# Patient Record
Sex: Male | Born: 1995
Health system: Southern US, Community
[De-identification: ages and names within clinical notes are randomized; demographics above are authoritative.]

## PROBLEM LIST (undated history)

## (undated) DIAGNOSIS — J302 Other seasonal allergic rhinitis: Secondary | ICD-10-CM

## (undated) DIAGNOSIS — F909 Attention-deficit hyperactivity disorder, unspecified type: Secondary | ICD-10-CM

## (undated) DIAGNOSIS — J45909 Unspecified asthma, uncomplicated: Secondary | ICD-10-CM

## (undated) DIAGNOSIS — Z9109 Other allergy status, other than to drugs and biological substances: Secondary | ICD-10-CM

## (undated) DIAGNOSIS — S62609A Fracture of unspecified phalanx of unspecified finger, initial encounter for closed fracture: Secondary | ICD-10-CM

## (undated) DIAGNOSIS — G43909 Migraine, unspecified, not intractable, without status migrainosus: Secondary | ICD-10-CM

## (undated) HISTORY — PX: TONSILLECTOMY: SUR1361

---
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
## 2017-02-11 ENCOUNTER — Encounter: Payer: Self-pay | Admitting: Surgical

## 2017-02-12 ENCOUNTER — Ambulatory Visit: Payer: Self-pay | Admitting: Family Medicine

## 2017-02-12 DIAGNOSIS — Z0289 Encounter for other administrative examinations: Secondary | ICD-10-CM

## 2017-02-18 ENCOUNTER — Telehealth (INDEPENDENT_AMBULATORY_CARE_PROVIDER_SITE_OTHER): Payer: Self-pay | Admitting: Orthopaedic Surgery

## 2017-02-18 NOTE — Telephone Encounter (Deleted)
Pt mother called to cancel surgery tomorrow with XU. Please call mother to confirm sx has been cancelent. Mother, Nathen May, request ret call [phone].

## 2017-02-18 NOTE — Telephone Encounter (Signed)
Wrong patient

## 2017-02-25 ENCOUNTER — Encounter: Payer: Self-pay | Admitting: Family Medicine

## 2017-02-25 ENCOUNTER — Ambulatory Visit (INDEPENDENT_AMBULATORY_CARE_PROVIDER_SITE_OTHER): Payer: BLUE CROSS/BLUE SHIELD | Admitting: Family Medicine

## 2017-02-25 DIAGNOSIS — F909 Attention-deficit hyperactivity disorder, unspecified type: Secondary | ICD-10-CM | POA: Diagnosis not present

## 2017-02-25 DIAGNOSIS — J45909 Unspecified asthma, uncomplicated: Secondary | ICD-10-CM

## 2017-02-25 DIAGNOSIS — J4530 Mild persistent asthma, uncomplicated: Secondary | ICD-10-CM | POA: Insufficient documentation

## 2017-02-25 MED ORDER — AMPHETAMINE-DEXTROAMPHETAMINE 10 MG PO TABS: ORAL_TABLET | ORAL | 0 refills | Status: DC

## 2017-02-25 NOTE — Progress Notes (Signed)
Charles Avila is a 21 y.o. [facility]   Patient Care Team: Charles Avila, Charles Avila as PCP - General (Family Medicine)   History of Present Illness:   Charles Avila, acting as scribe for Dr. Earlene Avila.  HPI:  Patient comes in today to establish care.  He will be starting school in the spring at [employer] in [place].  Patient has asthma.  Uses daily inhaler as well as rescue inhaler and nebulizer if needed.  States he doesn't have to use his rescue inhaler or nebulizer very often.  He previously played lacrosse in school and states he had to use his inhaler more at that time.  Patient also takes Concerta for ADHD.  No concerns or complaints today.  Needs to establish with a provider because he aged out of pediatrics.  Health Maintenance Due  Topic Date Due  . HIV Screening  03/02/2013   Depression screen PHQ 2/9 02/25/2017  Decreased Interest 0  Down, Depressed, Hopeless 0  PHQ - 2 Score 0   PMHx, SurgHx, SocialHx, Medications, and Allergies were reviewed in the Visit Navigator and updated as appropriate.   Past Medical History:  Diagnosis Date  . ADHD (attention deficit hyperactivity disorder)   . Asthma   . Migraine   . Multiple environmental allergies   . Seasonal allergies    Past Surgical History:  Procedure Laterality Date  . TONSILLECTOMY     Family History  Problem Relation Age of Onset  . Hypertension Father   . Hypertension Brother   . Diabetes Maternal Grandmother   . High Cholesterol Maternal Grandmother   . Hypertension Maternal Grandmother   . Mental illness Maternal Grandmother   . Diabetes Maternal Grandfather   . High Cholesterol Maternal Grandfather   . Hypertension Maternal Grandfather   . Cancer Paternal Grandmother   . Heart disease Paternal Grandfather   . Hypertension Paternal Grandfather    Social History  Substance Use Topics  . Smoking status: Never Smoker  . Smokeless tobacco: Never Used  . Alcohol use Yes   Comment: very rarely   Current Medications and Allergies:   .  albuterol (PROVENTIL HFA;VENTOLIN HFA) 108 (90 BASE) MCG/ACT inhaler, Inhale 2 puffs into the lungs every 6 (six) hours as needed. For shortness of breath or wheezing, Disp: , Rfl:  .  albuterol (PROVENTIL) (2.5 MG/3ML) 0.083% nebulizer solution, Take 2.5 mg by nebulization daily., Disp: , Rfl:  .  beclomethasone (QVAR) 40 MCG/ACT inhaler, Inhale 1 puff into the lungs daily., Disp: , Rfl:  .  cetirizine (ZYRTEC) 10 MG tablet, Take 10 mg by mouth daily., Disp: , Rfl:  .  levalbuterol (XOPENEX HFA) 45 MCG/ACT inhaler, Inhale 1 puff into the lungs daily., Disp: , Rfl:  .  Levalbuterol HCl (XOPENEX CONCENTRATE IN), Inhale 1 vial into the lungs 2 (two) times daily as needed.  .  methylphenidate (CONCERTA) 54 MG CR tablet, Take 54 mg by mouth every morning., Disp: , Rfl:  .  SUMAtriptan (IMITREX) 25 MG tablet, Take 25 mg by mouth every 2 (two) hours as needed. For headache, Disp: , Rfl:  .  topiramate (TOPAMAX) 25 MG tablet, Take 25 mg by mouth daily., Disp: , Rfl:   No Known Allergies   Review of Systems:   Pertinent items are noted in the HPI. Otherwise, ROS is negative.  Vitals:   Vitals:   02/25/17 0925  BP: 124/78  Pulse: 69  Temp: 98 F (36.7 C)  TempSrc: Oral  SpO2: 96%  Weight: 164 lb (74.4 kg)  Height: 5\' 7"  (1.702 m)     Body mass index is 25.69 kg/m.  Physical Exam:   Physical Exam  Constitutional: He is oriented to person, place, and time. He appears well-developed and well-nourished. No distress.  HENT:  Head: Normocephalic and atraumatic.  Right Ear: External ear normal.  Left Ear: External ear normal.  Nose: Nose normal.  Mouth/Throat: Oropharynx is clear and moist.  Eyes: Pupils are equal, round, and reactive to light. Conjunctivae and EOM are normal.  Neck: Normal range of motion. Neck supple.  Cardiovascular: Normal rate, regular rhythm, normal heart sounds and intact distal pulses.     Pulmonary/Chest: Effort normal and breath sounds normal.  Abdominal: Soft. Bowel sounds are normal.  Musculoskeletal: Normal range of motion.  Neurological: He is alert and oriented to person, place, and time.  Skin: Skin is warm and dry.  Psychiatric: He has a normal mood and affect. His behavior is normal. Judgment and thought content normal.  Nursing note and vitals reviewed.  Assessment and Plan:   Charles Avila was seen today for establish care.  Diagnoses and all orders for this visit:  Attention deficit hyperactivity disorder (ADHD), unspecified ADHD type Comments: ADHD symptoms not controlled well on current regimen. After discussion, patient would like to start below medication. Expectations, risks, and potential side effects reviewed. Will recheck in one month.  Orders: -     amphetamine-dextroamphetamine (ADDERALL) 10 MG tablet; Take 2 tablets in the morning and 1 tablet in the afternoon  Uncomplicated asthma, unspecified asthma severity, unspecified whether persistent Comments: Well controlled.  No signs of complications, medication side effects, or red flags.  Continue current regimen.     . Reviewed expectations re: course of current medical issues. . Discussed self-management of symptoms. . Outlined signs and symptoms indicating need for more acute intervention. . Patient verbalized understanding and all questions were answered. Marland Kitchen. Health Maintenance issues including appropriate healthy diet, exercise, and smoking avoidance were discussed with patient. . See orders for this visit as documented in the electronic medical record. . Patient received an After Visit Summary.  Avila served as Neurosurgeonscribe during this visit. History, Physical, and Plan performed by medical provider. The above documentation has been reviewed and is accurate and complete. Charles RimaErica Keli Avila, D.O.  Charles RimaErica Arnice Vanepps, Avila , [facility] 03/02/2017

## 2017-02-25 NOTE — Patient Instructions (Signed)
Stop Topamax.  Stop Concerta.  Start Adderall 10 mg.  You can take 1 in the morning and one in the afternoon to start.  If you need to you can increase to 2 in the morning and 1 in the afternoon.

## 2017-06-11 ENCOUNTER — Encounter: Payer: Self-pay | Admitting: *Deleted

## 2017-06-11 ENCOUNTER — Ambulatory Visit: Payer: BLUE CROSS/BLUE SHIELD | Admitting: Physician Assistant

## 2017-06-11 ENCOUNTER — Encounter: Payer: Self-pay | Admitting: Physician Assistant

## 2017-06-11 VITALS — BP 122/80 | HR 63 | Temp 97.6°F | Ht 67.0 in | Wt 162.4 lb

## 2017-06-11 DIAGNOSIS — F909 Attention-deficit hyperactivity disorder, unspecified type: Secondary | ICD-10-CM

## 2017-06-11 DIAGNOSIS — Z79899 Other long term (current) drug therapy: Secondary | ICD-10-CM | POA: Diagnosis not present

## 2017-06-11 MED ORDER — AMPHETAMINE-DEXTROAMPHETAMINE 10 MG PO TABS: ORAL_TABLET | ORAL | 0 refills | Status: DC

## 2017-06-11 NOTE — Progress Notes (Signed)
Charles Avila is a 21 y.o. male is here to discuss: ADHD  I acted as a Neurosurgeon for [employer], PA-C Corky Mull, LPN  History of Present Illness:   Chief Complaint  Patient presents with  . Follow-up  . ADHD    HPI  Pt is here to discuss ADHD medication, was on Concerta 54 mg 2 months ago. He states that he takes this medication 4 x a week.  He established care with Dr. Helane Rima on 02/25/2017 and was given a prescription to try Adderall 10 mg TID (1 in the morning and 2 in the afternoon) instead of Concerta, he reports that he never filled this medication and mother reports this as well. He has lost about 15 lb since being on Concerta, reports decreased appetite. Denies heart palpitations or any updates/changes to his family's cardiac history.   [place] Drug Database confirms that he did not fill this medication. He is agreeable to signing drug contract and having urine drug screen performed today.   Health Maintenance Due  Topic Date Due  . HIV Screening  03/02/2013  . TETANUS/TDAP  03/02/2017    Past Medical History:  Diagnosis Date  . ADHD (attention deficit hyperactivity disorder)   . Asthma   . Migraine   . Multiple environmental allergies   . Seasonal allergies      Social History   Socioeconomic History  . Marital status: Single    Spouse name: Not on file  . Number of children: Not on file  . Years of education: Not on file  . Highest education level: Not on file  Social Needs  . Financial resource strain: Not on file  . Food insecurity - worry: Not on file  . Food insecurity - inability: Not on file  . Transportation needs - medical: Not on file  . Transportation needs - non-medical: Not on file  Occupational History  . Not on file  Tobacco Use  . Smoking status: Never Smoker  . Smokeless tobacco: Never Used  Substance and Sexual Activity  . Alcohol use: Yes    Comment: very rarely  . Drug use: No  . Sexual activity: Yes    Partners: Female    Other Topics Concern  . Not on file  Social History Narrative  . Not on file    Past Surgical History:  Procedure Laterality Date  . TONSILLECTOMY      Family History  Problem Relation Age of Onset  . Hypertension Father   . Hypertension Brother   . Diabetes Maternal Grandmother   . High Cholesterol Maternal Grandmother   . Hypertension Maternal Grandmother   . Mental illness Maternal Grandmother   . Diabetes Maternal Grandfather   . High Cholesterol Maternal Grandfather   . Hypertension Maternal Grandfather   . Cancer Paternal Grandmother   . Heart disease Paternal Grandfather   . Hypertension Paternal Grandfather     PMHx, SurgHx, SocialHx, FamHx, Medications, and Allergies were reviewed in the Visit Navigator and updated as appropriate.   Patient Active Problem List   Diagnosis Date Noted  . ADHD 02/25/2017  . Asthma 02/25/2017    Social History   Tobacco Use  . Smoking status: Never Smoker  . Smokeless tobacco: Never Used  Substance Use Topics  . Alcohol use: Yes    Comment: very rarely  . Drug use: No    Current Medications and Allergies:    Current Outpatient Medications:  .  albuterol (PROVENTIL HFA;VENTOLIN HFA) 108 (  90 BASE) MCG/ACT inhaler, Inhale 2 puffs into the lungs every 6 (six) hours as needed. For shortness of breath or wheezing, Disp: , Rfl:  .  albuterol (PROVENTIL) (2.5 MG/3ML) 0.083% nebulizer solution, Take 2.5 mg by nebulization daily., Disp: , Rfl:  .  amphetamine-dextroamphetamine (ADDERALL) 10 MG tablet, Take 2 tablets in the morning and 1 tablet in the afternoon, Disp: 90 tablet, Rfl: 0 .  beclomethasone (QVAR) 40 MCG/ACT inhaler, Inhale 1 puff into the lungs daily., Disp: , Rfl:  .  cetirizine (ZYRTEC) 10 MG tablet, Take 10 mg by mouth daily., Disp: , Rfl:  .  levalbuterol (XOPENEX HFA) 45 MCG/ACT inhaler, Inhale 1 puff into the lungs daily., Disp: , Rfl:  .  Levalbuterol HCl (XOPENEX CONCENTRATE IN), Inhale 1 vial into the lungs 2  (two) times daily as needed. For shortness of breath or wheezing, Disp: , Rfl:  .  SUMAtriptan (IMITREX) 25 MG tablet, Take 25 mg by mouth every 2 (two) hours as needed. For headache, Disp: , Rfl:  .  topiramate (TOPAMAX) 25 MG tablet, Take 25 mg by mouth daily., Disp: , Rfl:   No Known Allergies  Review of Systems   ROS  Negative unless otherwise specified per HPI.  Vitals:   Vitals:   06/11/17 0940  BP: 122/80  Pulse: 63  Temp: 97.6 F (36.4 C)  TempSrc: Oral  SpO2: 96%  Weight: 162 lb 6.1 oz (73.7 kg)  Height: 5\' 7"  (1.702 m)     Body mass index is 25.43 kg/m.   Physical Exam:    Physical Exam  Constitutional: He appears well-developed. He is cooperative.  Non-toxic appearance. He does not have a sickly appearance. He does not appear ill. No distress.  Cardiovascular: Normal rate, regular rhythm, S1 normal, S2 normal, normal heart sounds and normal pulses.  No LE edema  Pulmonary/Chest: Effort normal and breath sounds normal.  Neurological: He is alert. GCS eye subscore is 4. GCS verbal subscore is 5. GCS motor subscore is 6.  Skin: Skin is warm, dry and intact.  Psychiatric: He has a normal mood and affect. His speech is normal and behavior is normal.  Nursing note and vitals reviewed.    Assessment and Plan:    Charles Avila was seen today for follow-up and adhd.  Diagnoses and all orders for this visit:  Attention deficit hyperactivity disorder (ADHD), unspecified ADHD type and High risk medication use Stop Concerta. Start Adderall 10 mg --> instructions from prior visit "You can take 1 in the morning and one in the afternoon to start.  If you need to you can increase to 2 in the morning and 1 in the afternoon." [place] Controlled Substance Database reviewed today regarding patient.  Patient is compliant with [facility] regarding pharmacy use and one-prescribing provider. It is appropriate to continue current medication regimen. Last UDS -- TODAY. Controlled substance contract  signed today. Follow-up with Dr. Helane RimaErica Wallace in 1 month to assess efficacy of medication change. -     Pain Mgmt, Profile 8 w/Conf, U -     amphetamine-dextroamphetamine (ADDERALL) 10 MG tablet; Take 2 tablets in the morning and 1 tablet in the afternoon  . Reviewed expectations re: course of current medical issues. . Discussed self-management of symptoms. . Outlined signs and symptoms indicating need for more acute intervention. . Patient verbalized understanding and all questions were answered. . See orders for this visit as documented in the electronic medical record. . Patient received an After Visit Summary.  CMA  or LPN served as Neurosurgeonscribe during this visit. History, Physical, and Plan performed by medical provider. Documentation and orders reviewed and attested to.  Jarold MottoSamantha Miral Hoopes, PA-C Charles Avila, [facility] 06/11/2017  Follow-up: Return in about 1 month (around 07/11/2017) for ADHD med f/u with [name].

## 2017-06-12 LAB — PAIN MGMT, PROFILE 8 W/CONF, U
6 Acetylmorphine: NEGATIVE ng/mL (ref <10)
ALCOHOL METABOLITES: NEGATIVE ng/mL (ref <500)
Amphetamines: NEGATIVE ng/mL (ref <500)
Benzodiazepines: NEGATIVE ng/mL (ref <100)
Buprenorphine, Urine: NEGATIVE ng/mL (ref <5)
COCAINE METABOLITE: NEGATIVE ng/mL (ref <150)
CREATININE: 275.3 mg/dL
MARIJUANA METABOLITE: NEGATIVE ng/mL (ref <20)
MDMA: NEGATIVE ng/mL (ref <500)
Opiates: NEGATIVE ng/mL (ref <100)
Oxidant: NEGATIVE ug/mL (ref <200)
Oxycodone: NEGATIVE ng/mL (ref <100)
PH: 6.66 (ref 4.5–9.0)

## 2017-07-09 ENCOUNTER — Ambulatory Visit: Payer: BLUE CROSS/BLUE SHIELD | Admitting: Family Medicine

## 2017-07-23 ENCOUNTER — Ambulatory Visit: Payer: BLUE CROSS/BLUE SHIELD | Admitting: Family Medicine

## 2017-08-11 DIAGNOSIS — R51 Headache: Secondary | ICD-10-CM | POA: Diagnosis not present

## 2017-08-22 ENCOUNTER — Encounter: Payer: Self-pay | Admitting: Family Medicine

## 2018-03-04 ENCOUNTER — Encounter: Payer: BLUE CROSS/BLUE SHIELD | Admitting: Family Medicine

## 2018-03-04 NOTE — Progress Notes (Signed)
ERROR

## 2018-06-19 ENCOUNTER — Other Ambulatory Visit: Payer: Self-pay

## 2018-06-19 ENCOUNTER — Ambulatory Visit: Payer: BLUE CROSS/BLUE SHIELD

## 2018-06-19 MED ORDER — ALBUTEROL SULFATE (2.5 MG/3ML) 0.083% IN NEBU: 2.5000 mg | INHALATION_SOLUTION | Freq: Every day | RESPIRATORY_TRACT | 3 refills | Status: DC

## 2018-07-08 ENCOUNTER — Other Ambulatory Visit (HOSPITAL_COMMUNITY)
Admission: RE | Admit: 2018-07-08 | Discharge: 2018-07-08 | Disposition: A | Discharge status: Home / Self Care | Payer: BLUE CROSS/BLUE SHIELD | Source: Ambulatory Visit | Attending: Family Medicine | Admitting: Family Medicine

## 2018-07-08 ENCOUNTER — Encounter: Payer: Self-pay | Admitting: Family Medicine

## 2018-07-08 ENCOUNTER — Ambulatory Visit (INDEPENDENT_AMBULATORY_CARE_PROVIDER_SITE_OTHER): Payer: BLUE CROSS/BLUE SHIELD | Admitting: Family Medicine

## 2018-07-08 VITALS — BP 118/58 | HR 74 | Temp 98.1°F | Ht 67.0 in | Wt 166.0 lb

## 2018-07-08 DIAGNOSIS — Z202 Contact with and (suspected) exposure to infections with a predominantly sexual mode of transmission: Secondary | ICD-10-CM | POA: Diagnosis not present

## 2018-07-08 DIAGNOSIS — Z9109 Other allergy status, other than to drugs and biological substances: Secondary | ICD-10-CM | POA: Diagnosis not present

## 2018-07-08 DIAGNOSIS — F5101 Primary insomnia: Secondary | ICD-10-CM

## 2018-07-08 DIAGNOSIS — J4540 Moderate persistent asthma, uncomplicated: Secondary | ICD-10-CM

## 2018-07-08 DIAGNOSIS — G43909 Migraine, unspecified, not intractable, without status migrainosus: Secondary | ICD-10-CM | POA: Insufficient documentation

## 2018-07-08 DIAGNOSIS — F909 Attention-deficit hyperactivity disorder, unspecified type: Secondary | ICD-10-CM | POA: Diagnosis not present

## 2018-07-08 LAB — COMPREHENSIVE METABOLIC PANEL WITH GFR
ALT: 10 U/L (ref 0–53)
AST: 14 U/L (ref 0–37)
Albumin: 4.9 g/dL (ref 3.5–5.2)
Alkaline Phosphatase: 63 U/L (ref 39–117)
BUN: 16 mg/dL (ref 6–23)
CO2: 32 meq/L (ref 19–32)
Calcium: 10.1 mg/dL (ref 8.4–10.5)
Chloride: 101 meq/L (ref 96–112)
Creatinine, Ser: 1.1 mg/dL (ref 0.40–1.50)
GFR: 109.37 mL/min
Glucose, Bld: 79 mg/dL (ref 70–99)
Potassium: 4.4 meq/L (ref 3.5–5.1)
Sodium: 139 meq/L (ref 135–145)
Total Bilirubin: 1.3 mg/dL — ABNORMAL HIGH (ref 0.2–1.2)
Total Protein: 7.3 g/dL (ref 6.0–8.3)

## 2018-07-08 LAB — CBC WITH DIFFERENTIAL/PLATELET
Basophils Absolute: 0 10*3/uL (ref 0.0–0.1)
Basophils Relative: 0.6 % (ref 0.0–3.0)
Eosinophils Absolute: 0.2 10*3/uL (ref 0.0–0.7)
Eosinophils Relative: 3.2 % (ref 0.0–5.0)
HCT: 45.5 % (ref 39.0–52.0)
Hemoglobin: 15.6 g/dL (ref 13.0–17.0)
Lymphocytes Relative: 37.4 % (ref 12.0–46.0)
Lymphs Abs: 2.7 10*3/uL (ref 0.7–4.0)
MCHC: 34.4 g/dL (ref 30.0–36.0)
MCV: 90.8 fl (ref 78.0–100.0)
Monocytes Absolute: 0.4 10*3/uL (ref 0.1–1.0)
Monocytes Relative: 6.3 % (ref 3.0–12.0)
Neutro Abs: 3.8 10*3/uL (ref 1.4–7.7)
Neutrophils Relative %: 52.5 % (ref 43.0–77.0)
Platelets: 324 10*3/uL (ref 150.0–400.0)
RBC: 5.01 Mil/uL (ref 4.22–5.81)
RDW: 12.5 % (ref 11.5–14.6)
WBC: 7.2 10*3/uL (ref 4.5–10.5)

## 2018-07-08 MED ORDER — TRAZODONE HCL 50 MG PO TABS: ORAL_TABLET | ORAL | 2 refills | Status: DC

## 2018-07-08 MED ORDER — AMPHETAMINE-DEXTROAMPHETAMINE 10 MG PO TABS: ORAL_TABLET | ORAL | 0 refills | Status: DC

## 2018-07-08 MED ORDER — ALBUTEROL SULFATE HFA 108 (90 BASE) MCG/ACT IN AERS: 2.0000 | INHALATION_SPRAY | Freq: Four times a day (QID) | RESPIRATORY_TRACT | 6 refills | Status: DC | PRN

## 2018-07-08 MED ORDER — BUDESONIDE-FORMOTEROL FUMARATE 160-4.5 MCG/ACT IN AERO: 2.0000 | INHALATION_SPRAY | Freq: Two times a day (BID) | RESPIRATORY_TRACT | 3 refills | Status: DC

## 2018-07-08 NOTE — Progress Notes (Signed)
Charles Avila is a 22 y.o. male is here for follow up.  History of Present Illness:   Charles Avila, CMA, scribe for Dr. [name].  HPI: Since the last visit has the patient had any:  Appetite changes? No Unintentional weight loss? No Is medication working well ? Yes Does patient take drug holidays? No Difficulties falling to sleep or maintaining sleep? Yes trouble going to sleep every night.  Any anxiety? No Any cardiac issues (fainting or paliptations)? No Suicidal thoughts? No Changes in health since last visit? No New medications? No Any illicit substance abuse? No Has the patient taken his medication today? No   Patient has been having increased problems with asthma currently on Qvar daily with rescue inhalers and nebulizer. Has noticed for the last month having more shortness of breath. He has been using nebulizer once to twice a week.   Health Maintenance Due  Topic Date Due  . HIV Screening  03/02/2013  . TETANUS/TDAP  03/02/2017  . INFLUENZA VACCINE  02/26/2018   [facility] 07/08/2018 02/25/2017  Decreased Interest 0 0  Down, Depressed, Hopeless 0 0  PHQ - 2 Score 0 0  Altered sleeping 1 -  Tired, decreased energy 0 -  Change in appetite 0 -  Feeling bad or failure about yourself  0 -  Trouble concentrating 0 -  Moving slowly or fidgety/restless 0 -  Suicidal thoughts 0 -  PHQ-9 Score 1 -  Difficult doing work/chores Not difficult at all -   PMHx, SurgHx, SocialHx, FamHx, Medications, and Allergies were reviewed in the Visit Navigator and updated as appropriate.   Patient Active Problem List   Diagnosis Date Noted  . Migraine   . Multiple environmental allergies   . ADHD 02/25/2017  . Asthma 02/25/2017   Social History   Tobacco Use  . Smoking status: Never Smoker  . Smokeless tobacco: Never Used  Substance Use Topics  . Alcohol use: Yes    Comment: very rarely  . Drug use: No   Current Medications and Allergies:   .   albuterol (PROVENTIL HFA;VENTOLIN HFA) 108 (90 BASE) MCG/ACT inhaler, Inhale 2 puffs into the lungs every 6 (six) hours as needed. For shortness of breath or wheezing, Disp: , Rfl:  .  albuterol (PROVENTIL) (2.5 MG/3ML) 0.083% nebulizer solution, Take 3 mLs (2.5 mg total) by nebulization daily., Disp: 75 mL, Rfl: 3 .  amphetamine-dextroamphetamine (ADDERALL) 10 MG tablet, Take 2 tablets in the morning and 1 tablet in the afternoon, Disp: 90 tablet, Rfl: 0 .  beclomethasone (QVAR) 40 MCG/ACT inhaler, Inhale 1 puff into the lungs daily., Disp: , Rfl:  .  cetirizine (ZYRTEC) 10 MG tablet, Take 10 mg by mouth daily., Disp: , Rfl:  .  levalbuterol (XOPENEX HFA) 45 MCG/ACT inhaler, Inhale 1 puff into the lungs daily., Disp: , Rfl:  .  Levalbuterol HCl (XOPENEX CONCENTRATE IN), Inhale 1 vial into the lungs 2 (two) times daily as needed. For shortness of breath or wheezing, Disp: , Rfl:  .  SUMAtriptan (IMITREX) 25 MG tablet, Take 25 mg by mouth every 2 (two) hours as needed. For headache, Disp: , Rfl:   No Known Allergies   Review of Systems   Pertinent items are noted in the HPI. Otherwise, a complete ROS is negative.  Vitals:   Vitals:   07/08/18 1142  BP: (!) 118/58  Pulse: 74  Temp: 98.1 F (36.7 C)  TempSrc: Oral  SpO2: 97%  Weight: 166 lb (  75.3 kg)  Height: 5\' 7"  (1.702 m)     Body mass index is 26 kg/m.  Physical Exam:   Physical Exam  Constitutional: He is oriented to person, place, and time. He appears well-developed and well-nourished. No distress.  HENT:  Head: Normocephalic and atraumatic.  Right Ear: External ear normal.  Left Ear: External ear normal.  Nose: Nose normal.  Mouth/Throat: Oropharynx is clear and moist.  Eyes: Pupils are equal, round, and reactive to light. Conjunctivae and EOM are normal.  Neck: Normal range of motion. Neck supple.  Cardiovascular: Normal rate, regular rhythm, normal heart sounds and intact distal pulses.  Pulmonary/Chest: Effort  normal and breath sounds normal.  Abdominal: Soft. Bowel sounds are normal.  Musculoskeletal: Normal range of motion.  Neurological: He is alert and oriented to person, place, and time.  Skin: Skin is warm and dry.  Psychiatric: He has a normal mood and affect. His behavior is normal. Judgment and thought content normal.  Nursing note and vitals reviewed.  Assessment and Plan:   Charles Avila was seen today for follow-up.  Diagnoses and all orders for this visit:  Moderate persistent asthma without complication Comments: Will advance treatment to Symbicort. He will call if no improvement.  Orders: -     albuterol (PROVENTIL HFA;VENTOLIN HFA) 108 (90 Base) MCG/ACT inhaler; Inhale 2 puffs into the lungs every 6 (six) hours as needed. For shortness of breath or wheezing -     DME Nebulizer machine -     budesonide-formoterol (SYMBICORT) 160-4.5 MCG/ACT inhaler; Inhale 2 puffs into the lungs 2 (two) times daily.  Multiple environmental allergies Comments: Restart Zyrtec. Orders: -     CBC with Differential/Platelet -     Comprehensive metabolic panel  Exposure to STD -     HIV Antibody (routine testing w rflx) -     Urine cytology ancillary only([facility])  Attention deficit hyperactivity disorder (ADHD), unspecified ADHD type Comments: School going well. Schedule difficult with early classes, study hall at night, practice/games several times per week. Orders: -     amphetamine-dextroamphetamine (ADDERALL) 10 MG tablet; Take 2 tablets in the morning and 1 tablet in the afternoon -     amphetamine-dextroamphetamine (ADDERALL) 10 MG tablet; 2 tablets in the am and one in the afternoon. -     amphetamine-dextroamphetamine (ADDERALL) 10 MG tablet; 2 tablets in the am and one in the afternoon.  Primary insomnia Comments: Will trial Trazodone. Discussed sleep hygeine. Orders: -     traZODone (DESYREL) 50 MG tablet; 0.5 to two tabs at night.    . Orders and follow up as documented in  [facility], reviewed diet, exercise and weight control, cardiovascular risk and specific lipid/LDL goals reviewed, reviewed medications and side effects in detail.  . Reviewed expectations re: course of current medical issues. . Outlined signs and symptoms indicating need for more acute intervention. . Patient verbalized understanding and all questions were answered. . Patient received an After Visit Summary.  CMA served as Neurosurgeon during this visit. History, Physical, and Plan performed by medical provider. The above documentation has been reviewed and is accurate and complete. Helane Rima, D.O.  Helane Rima, DO [place], [facility] 07/08/2018

## 2018-07-08 NOTE — Patient Instructions (Signed)
15 Tips to a Better Night's Sleep   Practice "good sleep hygiene". Here are some tips for you to try:   1. No reading or watching TV in bed. These are waking activities.  2. Go to bed when you're sleepy-tired, not when it's time to go to bed by habit.  3. Wind down during the second half of the evening before bedtime. Don't get involved in any kind of anxiety-provoking activities of thought 90 minutes before retiring to bed.  4. Do some breathing exercises or try to relax major muscle groups. Start at the toes and work up the body all the way to the forehead.  5. Your bed is for sleeping, so if you cannot sleep after 15-20 minutes in bed, get up and do something relaxing.  6. Have your room cool instead of warm.  7. Don't count sheep--counting is stimulating.  8. Exercise in the afternoon or early evening, but no later than three hours before bedtime.  9. Don't overeat or eat two to three hours before bedtime.  10. Try not to nap during the day.  11. If you awake in the middle of the night and can't get back to sleep within 30 minutes, get up and do something relaxing (no TV or reading anything stimulating).  12. Have no caffeine, alcohol or cigarettes two to three hours before retiring to bed.  13. If you have disturbing dreams or nightmares repeatedly, try to add an ending you enjoy or like better.  14. Keep a sleep journal. Thirty minutes before you go to bed, write down your concerns and hopes. It frees up your sleep from processing your dilemmas.  15. Listen to calming music or recorded sounds (ocean, forest, birds, crickets, brook) before bed.   If sleep problems persist, contact your physician or mental health professional. Let them know what is happening in your life. Your problem may have either organic or psychological contributors. Sleep disorders are considered chronic if they persist over more than one month.   

## 2018-07-09 LAB — HIV ANTIBODY (ROUTINE TESTING W REFLEX): HIV 1&2 Ab, 4th Generation: NONREACTIVE

## 2018-07-10 LAB — URINE CYTOLOGY ANCILLARY ONLY
Chlamydia: NEGATIVE
Neisseria Gonorrhea: NEGATIVE

## 2018-08-29 ENCOUNTER — Other Ambulatory Visit: Payer: Self-pay | Admitting: Family Medicine

## 2018-08-29 DIAGNOSIS — J4540 Moderate persistent asthma, uncomplicated: Secondary | ICD-10-CM

## 2018-08-29 DIAGNOSIS — F5101 Primary insomnia: Secondary | ICD-10-CM

## 2019-02-23 ENCOUNTER — Other Ambulatory Visit: Payer: Self-pay | Admitting: Family Medicine

## 2019-03-12 ENCOUNTER — Other Ambulatory Visit: Payer: Self-pay

## 2019-03-12 ENCOUNTER — Encounter (INDEPENDENT_AMBULATORY_CARE_PROVIDER_SITE_OTHER): Payer: BLUE CROSS/BLUE SHIELD | Admitting: Family Medicine

## 2019-03-12 NOTE — Progress Notes (Signed)
ERROR

## 2019-07-21 ENCOUNTER — Telehealth: Payer: Self-pay | Admitting: Family Medicine

## 2019-07-21 NOTE — Telephone Encounter (Signed)
05/25/2019:   I contacted the patient's mother and left a voicemail that billing is working on an appeal and they stated "In review it looks like these DOS were filed to that Member ID ,however, due the ID number missing the 01 and 06 the claims were rejected as no coverage for those DOS.  This caused them to file to Patient. Please know these will now deny for timely filing as the Original claim submission was 90 days. we will try to watch for these and appeal with POF to try and get these paid, but not a 100% guarantee it will work."  Billing asked for the patient's mother to contact them at [phone] for one of the billing reps if she has any further questions.   No further action required. No need to route note, for documentation purposes only.      05/25/2019:  Note   I have emailed charge corrections to resubmit claims with the following messages:   Please resubmit the claims for the following below. I spoke with Enrigue Catena today who was able to get insurance information to resubmit claims for her son Charles Avila. I have applied the insurance to all dates she requested from 07/08/2018 the claims just need to be resubmitted.  Insurance Information:  [employer] [place] Subscriber ID: [id number] Group ID: [id number]  Dates of Service For Resubmission for Charles Avila [id number]: 07/08/2018  Please let me know once this is processed successfully so I can contact Charles Avila to update.   No need to route note, for documentation purposes only.      04/02/2019:  Note   Patient's father Mr. Brucato came into the office today regarding the bill that the patient received due to the [employer] incident and spoke with front office who wanted me toverify what the real time eligibility was saying which was that the ID # [id number] that was the [employer] per the patient's father stated that the insurance started in Avila 2020.   I printed for him what we were receiving from the [facility] and encouraged him to contact the insurance company because this is the information they were providing.  Mr. Menz said that he would contact insurance and I provided my business card for him to reach out so the next step that I could do was email charge corrections to resubmit the claim.    No need to route note, for documentation purposes only. Awaiting a call from Mr. Keehan.      I sent a message on 03/18/2019 to the patient's mother stating:  "Hi Charles,  I'm sorry I could not be of better help with this matter. I hope this gets resolved for you.   Thanks, Marcene Brawn"  Last read by Brett Albino at 11:21 AM on 05/25/2019.  I received a message from the patient's mother Charles Avila on 03/18/2019 stating:   "Again, I do not have the COB[employer] information.  I'm not going back and forth again regarding this matter.  I will send in a dispute to the collection agency along with my email trail.  Thanks, Charles"  I sent a message on 03/18/2019 to the patient's mother stating:   "Hi Charles Avila,  This is [place], [employer] at [employer]. I received a message today concerning your concern about charges from 04/28/18-07/28/18 and that billing sent you to corrections. I am not sure who you spoke with previously, I did check your chart and unfortunately I was  unable to find the documentation of whom you spoke with or your [employer] ID #. I would suggest you reaching out to [employer] so they can give you the ID #.   We do not have a billing personnel here in the office however, I would love to help you find a solution and e-mail charge corrections to resubmit the claims for you for the dates listed above.   Please call me at [phone] or send me a mychart message with your [employer] ID # so I can get this resolved for you with billing.   Thank you"  Last read by Vicente Males at 11:19 AM on 05/25/2019.

## 2019-08-12 ENCOUNTER — Encounter (INDEPENDENT_AMBULATORY_CARE_PROVIDER_SITE_OTHER): Payer: Self-pay | Admitting: Family Medicine

## 2019-08-13 ENCOUNTER — Ambulatory Visit: Payer: 59 | Attending: Internal Medicine

## 2019-08-13 DIAGNOSIS — Z20822 Contact with and (suspected) exposure to covid-19: Secondary | ICD-10-CM

## 2019-08-14 ENCOUNTER — Other Ambulatory Visit: Payer: Self-pay | Admitting: Family Medicine

## 2019-08-14 DIAGNOSIS — F909 Attention-deficit hyperactivity disorder, unspecified type: Secondary | ICD-10-CM

## 2019-08-14 LAB — NOVEL CORONAVIRUS, NAA: SARS-CoV-2, NAA: NOT DETECTED

## 2019-08-16 ENCOUNTER — Other Ambulatory Visit: Payer: Self-pay

## 2019-08-16 MED ORDER — ALBUTEROL SULFATE (2.5 MG/3ML) 0.083% IN NEBU: 2.5000 mg | INHALATION_SOLUTION | Freq: Every day | RESPIRATORY_TRACT | 0 refills | Status: DC

## 2019-08-16 NOTE — Telephone Encounter (Signed)
Needs OV.  Charles Avila. Jimmey Ralph, MD 08/16/2019 12:48 PM

## 2019-08-16 NOTE — Telephone Encounter (Signed)
Patient has not had filled in over a year. Last seen by wallace 07/08/2018. Has a TOC app with you on 11/15/2019. Tried to refuse but it will not let me. Get a pop up that I do not have authority to decline.

## 2019-08-16 NOTE — Telephone Encounter (Signed)
Called and scheduled TOC appt for pt

## 2019-08-16 NOTE — Telephone Encounter (Signed)
Please inform patient that Dr. Earlene Plater is no longer at this office and to schedule an appointment with another provider for future refills. Thanks.

## 2019-08-16 NOTE — Telephone Encounter (Signed)
Please review

## 2019-08-17 NOTE — Telephone Encounter (Signed)
Patient has an appointment on 11/05/19 for TOC.

## 2019-08-18 NOTE — Telephone Encounter (Signed)
Called and scheduled OV with pt for 1/26 at 10am

## 2019-08-22 DIAGNOSIS — G43109 Migraine with aura, not intractable, without status migrainosus: Secondary | ICD-10-CM | POA: Insufficient documentation

## 2019-08-24 ENCOUNTER — Ambulatory Visit (INDEPENDENT_AMBULATORY_CARE_PROVIDER_SITE_OTHER): Payer: 59 | Admitting: Family Medicine

## 2019-08-24 ENCOUNTER — Encounter: Payer: Self-pay | Admitting: Family Medicine

## 2019-08-24 VITALS — BP 110/72 | HR 70 | Temp 97.7°F | Ht 67.0 in | Wt 180.4 lb

## 2019-08-24 DIAGNOSIS — G8929 Other chronic pain: Secondary | ICD-10-CM | POA: Diagnosis not present

## 2019-08-24 DIAGNOSIS — M545 Low back pain: Secondary | ICD-10-CM

## 2019-08-24 DIAGNOSIS — F902 Attention-deficit hyperactivity disorder, combined type: Secondary | ICD-10-CM

## 2019-08-24 DIAGNOSIS — F909 Attention-deficit hyperactivity disorder, unspecified type: Secondary | ICD-10-CM

## 2019-08-24 MED ORDER — MELOXICAM 15 MG PO TABS: 15.0000 mg | ORAL_TABLET | Freq: Every day | ORAL | 0 refills | Status: DC

## 2019-08-24 MED ORDER — AMPHETAMINE-DEXTROAMPHETAMINE 10 MG PO TABS: ORAL_TABLET | ORAL | 0 refills | Status: AC

## 2019-08-24 NOTE — Assessment & Plan Note (Signed)
No red flags.  Has small amount of sciatica.  Will start meloxicam 50 mg daily for 1 to 2 weeks.  Discussed home exercises and handout was given.  If no improvement will need referral to sports medicine.

## 2019-08-24 NOTE — Patient Instructions (Addendum)
It was very nice to see you today!  I will refill your Adderall.  Please try the meloxicam for the next 1 to 2 weeks.  Work on the exercises.  I will see you back in 3 months.  Come back to me sooner if needed.  Take care, Dr Jimmey Ralph  Please try these tips to maintain a healthy lifestyle:   Eat at least 3 REAL meals and 1-2 snacks per day.  Aim for no more than 5 hours between eating.  If you eat breakfast, please do so within one hour of getting up.    Each meal should contain half fruits/vegetables, one quarter protein, and one quarter carbs (no bigger than a computer mouse)   Cut down on sweet beverages. This includes juice, soda, and sweet tea.     Drink at least 1 glass of water with each meal and aim for at least 8 glasses per day   Exercise at least 150 minutes every week.

## 2019-08-24 NOTE — Progress Notes (Signed)
   Charles Avila is a 24 y.o. male who presents today for an office visit.  Assessment/Plan:  Chronic Problems Addressed Today: Chronic low back pain No red flags.  Has small amount of sciatica.  Will start meloxicam 50 mg daily for 1 to 2 weeks.  Discussed home exercises and handout was given.  If no improvement will need referral to sports medicine.  ADHD Stable. Database with no red flags.  Will refill Adderall 20 mg in the morning and 10 mg in the afternoon.     Subjective:  HPI:  Patient here today for Adderall refill.  Has a longstanding history of ADHD.  Is currently on Adderall 20 mg in the morning and 10 mg in the afternoon.  Tolerating well without side effects.  Medications help with ability to stay focused on task at school.  He has also had low back pain for several years.  Located in right lower back and radiates into his right thigh.  No obvious injuries or other precipitating events.  Symptoms seem to be getting worse.  Has tried massages with no significant improvement.       Objective:  Physical Exam: BP 110/72   Pulse 70   Temp 97.7 F (36.5 C)   Ht 5\' 7"  (1.702 m)   Wt 180 lb 6.1 oz (81.8 kg)   SpO2 97%   BMI 28.25 kg/m   Gen: No acute distress, resting comfortably CV: Regular rate and rhythm with no murmurs appreciated Pulm: Normal work of breathing, clear to auscultation bilaterally with no crackles, wheezes, or rhonchi MSK: --Back: No deformities.  Tender palpation along right paraspinal muscles -Lower extremities: Full range of motion throughout.  Straight leg raise positive on the right.  Reflexes 2+ and symmetric bilaterally. Neuro: Grossly normal, moves all extremities Psych: Normal affect and thought content      Emelina Hinch M. , MD 08/24/2019 10:23 AM

## 2019-08-24 NOTE — Assessment & Plan Note (Addendum)
Stable. Database with no red flags.  Will refill Adderall 20 mg in the morning and 10 mg in the afternoon.

## 2019-09-15 ENCOUNTER — Other Ambulatory Visit: Payer: Self-pay | Admitting: Family Medicine

## 2019-09-22 DIAGNOSIS — Z9109 Other allergy status, other than to drugs and biological substances: Secondary | ICD-10-CM | POA: Insufficient documentation

## 2019-10-22 DIAGNOSIS — H5111 Convergence insufficiency: Secondary | ICD-10-CM | POA: Insufficient documentation

## 2019-11-05 ENCOUNTER — Encounter: Payer: 59 | Admitting: Family Medicine

## 2019-11-05 ENCOUNTER — Other Ambulatory Visit: Payer: Self-pay

## 2019-11-05 DIAGNOSIS — Z0289 Encounter for other administrative examinations: Secondary | ICD-10-CM

## 2020-05-22 ENCOUNTER — Emergency Department (HOSPITAL_BASED_OUTPATIENT_CLINIC_OR_DEPARTMENT_OTHER)
Admission: EM | Admit: 2020-05-22 | Discharge: 2020-05-22 | Disposition: A | Discharge status: Home / Self Care | Payer: No Typology Code available for payment source | Attending: Emergency Medicine | Admitting: Emergency Medicine

## 2020-05-22 ENCOUNTER — Encounter (HOSPITAL_BASED_OUTPATIENT_CLINIC_OR_DEPARTMENT_OTHER): Payer: Self-pay

## 2020-05-22 ENCOUNTER — Telehealth: Payer: Self-pay

## 2020-05-22 ENCOUNTER — Other Ambulatory Visit: Payer: Self-pay

## 2020-05-22 DIAGNOSIS — R030 Elevated blood-pressure reading, without diagnosis of hypertension: Secondary | ICD-10-CM | POA: Insufficient documentation

## 2020-05-22 DIAGNOSIS — H1133 Conjunctival hemorrhage, bilateral: Secondary | ICD-10-CM | POA: Diagnosis not present

## 2020-05-22 DIAGNOSIS — J45909 Unspecified asthma, uncomplicated: Secondary | ICD-10-CM | POA: Diagnosis not present

## 2020-05-22 DIAGNOSIS — H579 Unspecified disorder of eye and adnexa: Secondary | ICD-10-CM | POA: Diagnosis present

## 2020-05-22 NOTE — Discharge Instructions (Signed)
You have been diagnosed with a subconjunctival hemorrhage.  This is basically a bruise of your eye.  It will take some time to resolve.  I recommend following up with your primary care doctor and continue to keep an eye on your symptoms.  You may always return to the ER for any new or concerning symptoms.  You may also follow-up with an ophthalmologist if this worsens or if you have any vision changes.  Please minimize any forceful blowing of her nose, forceful coughing, or drinking as this may cause vomiting which can cause these to worsen.  This may look somewhat worse over the last couple days but should progressively improve after that.

## 2020-05-22 NOTE — Telephone Encounter (Signed)
Noted  

## 2020-05-22 NOTE — Telephone Encounter (Signed)
Patient is scheduled for Dr.Parker tomorrow.   Nurse Assessment Nurse: Scarlette Ar, RN, Heather Date/Time (Eastern Time): 05/22/2020 12:58:43 PM Confirm and document reason for call. If symptomatic, describe symptoms. ---Caller states her son has broken blood vessels in his eye and yellow tint to the white of the eyeball. This started about 4 days ago with the blood and the yellow tint started today. Does the patient have any new or worsening symptoms? ---Yes Will a triage be completed? ---Yes Related visit to physician within the last 2 weeks? ---No Does the PT have any chronic conditions? (i.e. diabetes, asthma, this includes High risk factors for pregnancy, etc.) ---Yes List chronic conditions. ---asthma, allergies, Is this a behavioral health or substance abuse call? ---No Guidelines Guideline Title Affirmed Question Affirmed Notes Nurse Date/Time (Eastern Time) Eye - Red Without Pus [1] Eye pain AND [2] more than mild Standifer, RN, Heather 05/22/2020 12:59:53 PM Disp. Time Lamount Cohen Time) Disposition Final User 05/22/2020 1:03:06 PM See HCP within 4 Hours (or PCP triage) Yes Standifer, RN, Heather PLEASE NOTE: All timestamps contained within this report are represented as [place] Standard Time. CONFIDENTIALTY NOTICE: This fax transmission is intended only for the addressee. It contains information that is legally privileged, confidential or otherwise protected from use or disclosure. If you are not the intended recipient, you are strictly prohibited from reviewing, disclosing, copying using or disseminating any of this information or taking any action in reliance on or regarding this information. If you have received this fax in error, please notify us immediately by telephone so that we can arrange for its return to [place]. Phone: [phone], Toll-Free: [phone], Fax: [phone] Page: 2 of 2 Call Id: [id number] Caller Disagree/Comply Comply Caller Understands  Yes PreDisposition Call Doctor Care Advice Given Per Guideline SEE HCP (OR PCP TRIAGE) WITHIN 4 HOURS: * IF OFFICE WILL BE OPEN: You need to be seen within the next 3 or 4 hours. Call your doctor (or NP/PA) now or as soon as the office opens. * You become worse CALL BACK IF: CARE ADVICE given per Eye - Red without Pus (Adult) guideline. Referrals GO TO FACILITY OTHER - SPECIF

## 2020-05-22 NOTE — ED Triage Notes (Signed)
Pt with redness to both eyes started 10/22-right worse than left-denies injury-NAD-steady gait
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
## 2020-05-23 ENCOUNTER — Encounter: Payer: Self-pay | Admitting: Family Medicine

## 2020-05-23 ENCOUNTER — Telehealth (INDEPENDENT_AMBULATORY_CARE_PROVIDER_SITE_OTHER): Payer: No Typology Code available for payment source | Admitting: Family Medicine

## 2020-05-23 VITALS — Ht 68.0 in | Wt 175.0 lb

## 2020-05-23 DIAGNOSIS — H1131 Conjunctival hemorrhage, right eye: Secondary | ICD-10-CM | POA: Diagnosis not present
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
## 2020-07-03 ENCOUNTER — Encounter: Payer: Self-pay | Admitting: Family Medicine

## 2020-07-03 DIAGNOSIS — Z0289 Encounter for other administrative examinations: Secondary | ICD-10-CM

## 2022-03-06 ENCOUNTER — Encounter (INDEPENDENT_AMBULATORY_CARE_PROVIDER_SITE_OTHER): Payer: Self-pay

## 2023-05-21 ENCOUNTER — Ambulatory Visit: Payer: No Typology Code available for payment source | Admitting: Family Medicine

## 2024-06-05 ENCOUNTER — Other Ambulatory Visit: Payer: Self-pay

## 2024-06-05 ENCOUNTER — Emergency Department (HOSPITAL_BASED_OUTPATIENT_CLINIC_OR_DEPARTMENT_OTHER)
Admission: EM | Admit: 2024-06-05 | Discharge: 2024-06-05 | Disposition: A | Discharge status: Home / Self Care | Attending: Emergency Medicine | Admitting: Emergency Medicine

## 2024-06-05 ENCOUNTER — Encounter (HOSPITAL_BASED_OUTPATIENT_CLINIC_OR_DEPARTMENT_OTHER): Payer: Self-pay | Admitting: Emergency Medicine

## 2024-06-05 ENCOUNTER — Emergency Department (HOSPITAL_BASED_OUTPATIENT_CLINIC_OR_DEPARTMENT_OTHER)

## 2024-06-05 DIAGNOSIS — S20211A Contusion of right front wall of thorax, initial encounter: Secondary | ICD-10-CM | POA: Diagnosis not present

## 2024-06-05 DIAGNOSIS — S29001A Unspecified injury of muscle and tendon of front wall of thorax, initial encounter: Secondary | ICD-10-CM | POA: Diagnosis present

## 2024-06-05 HISTORY — DX: Fracture of unspecified phalanx of unspecified finger, initial encounter for closed fracture: S62.609A

## 2024-06-05 MED ORDER — HYDROCODONE-ACETAMINOPHEN 5-325 MG PO TABS
2.0000 | ORAL_TABLET | Freq: Once | ORAL | Status: AC
  Administered 2024-06-05: 2 via ORAL
  Filled 2024-06-05: qty 2

## 2024-06-05 MED ORDER — HYDROCODONE-ACETAMINOPHEN 5-325 MG PO TABS: 1.0000 | ORAL_TABLET | Freq: Four times a day (QID) | ORAL | 0 refills | Status: DC | PRN
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
## 2024-06-05 NOTE — Discharge Instructions (Signed)
 Begin taking ibuprofen  600 mg every 6 hours as needed for pain.  Begin taking hydrocodone as prescribed as needed for pain not relieved with ibuprofen .  Rest.  Follow-up with primary doctor if not improving in the next 1 to 2 weeks, and return to the ER if symptoms significantly worsen or change.
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
## 2024-06-08 DIAGNOSIS — R0789 Other chest pain: Secondary | ICD-10-CM | POA: Diagnosis not present

## 2024-06-09 ENCOUNTER — Telehealth: Payer: Self-pay

## 2024-06-09 DIAGNOSIS — S2231XA Fracture of one rib, right side, initial encounter for closed fracture: Secondary | ICD-10-CM | POA: Diagnosis not present

## 2024-06-09 NOTE — Telephone Encounter (Signed)
 Copied from CRM [id number]. Topic: Appointments - Scheduling Inquiry for Clinic >> Jun 09, 2024 12:09 PM Roselie BROCKS wrote: Reason for CRM: Patient was discharged and would to come back to clinic Request a return call to patient

## 2024-06-09 NOTE — Telephone Encounter (Signed)
 Phone # is not a working number. Pt can establish at any other [facility] just not [facility].

## 2024-06-17 ENCOUNTER — Ambulatory Visit: Payer: Self-pay
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
## 2024-06-18 ENCOUNTER — Ambulatory Visit (HOSPITAL_BASED_OUTPATIENT_CLINIC_OR_DEPARTMENT_OTHER): Payer: Self-pay | Admitting: Family Medicine

## 2024-06-18 ENCOUNTER — Encounter (HOSPITAL_BASED_OUTPATIENT_CLINIC_OR_DEPARTMENT_OTHER): Payer: Self-pay | Admitting: Family Medicine

## 2024-06-18 ENCOUNTER — Ambulatory Visit (HOSPITAL_BASED_OUTPATIENT_CLINIC_OR_DEPARTMENT_OTHER): Admitting: Family Medicine

## 2024-06-18 ENCOUNTER — Ambulatory Visit (INDEPENDENT_AMBULATORY_CARE_PROVIDER_SITE_OTHER)

## 2024-06-18 VITALS — BP 134/97 | HR 68 | Ht 69.0 in | Wt 175.7 lb

## 2024-06-18 DIAGNOSIS — R030 Elevated blood-pressure reading, without diagnosis of hypertension: Secondary | ICD-10-CM

## 2024-06-18 DIAGNOSIS — S2231XG Fracture of one rib, right side, subsequent encounter for fracture with delayed healing: Secondary | ICD-10-CM | POA: Diagnosis not present

## 2024-06-18 DIAGNOSIS — S2241XG Multiple fractures of ribs, right side, subsequent encounter for fracture with delayed healing: Secondary | ICD-10-CM

## 2024-06-18 DIAGNOSIS — S2231XA Fracture of one rib, right side, initial encounter for closed fracture: Secondary | ICD-10-CM | POA: Diagnosis not present

## 2024-06-18 DIAGNOSIS — Z7689 Persons encountering health services in other specified circumstances: Secondary | ICD-10-CM | POA: Diagnosis not present

## 2024-06-18 MED ORDER — OXYCODONE-ACETAMINOPHEN 5-325 MG PO TABS: 1.0000 | ORAL_TABLET | Freq: Three times a day (TID) | ORAL | 0 refills | Status: AC | PRN

## 2024-06-18 NOTE — Progress Notes (Signed)
 Discussed patient's x-ray results over the phone with minimally displaced right anterior ninth rib fracture that is unchanged from prior imaging.  Suspected nondisplaced fracture of the right anterior 10th rib as well.  Patient will continue with current pain management and prevention measures.  Work note provided as recommended not to engage in any lifting or significant movement and exercise.  He will obtain the incentive spirometer to use multiple times a day as well.  Ortho referral placed given minimally displaced rib fracture.

## 2024-06-18 NOTE — Patient Instructions (Addendum)
 Go to 2nd floor for xray (Orthopedics)    Alternate Tylenol  and Ibuprofen  every 6-8 hours with food. Use heating pad to right rib area  Use incentive spirometer 3-4 times a day to prevent pneumonia.

## 2024-06-18 NOTE — Progress Notes (Signed)
 New Patient Office Visit  Subjective:   Charles Avila Feb 17, 1996 06/18/2024  Chief Complaint  Patient presents with   New Patient (Initial Visit)    Patient is here today to get established with the practice. Has had multiple urgent care visits due to rib pain and was told that he had a displacement fracture of rib.    Discussed the use of AI scribe software for clinical note transcription with the patient, who gave verbal consent to proceed.  History of Present Illness Charles Avila is a 28 year old male who presents with persistent rib pain following a minimally displaced anterior right ninth rib fracture.  He has been experiencing persistent pain in the right side of his ribs following an injury sustained on May 27, 2024, at [employer]. Initial imaging at the ER on June 05, 2024, did not show a rib fracture, but the patient recalls being told there may have been a possible hairline fracture. Subsequent imaging at urgent care on June 08, 2024, confirmed a minimally displaced anterior right ninth rib fracture.  The pain is described as severe, with a rating of 7.5 to 8 out of 10, and has not significantly changed or improved since the injury. No shortness of breath, coughing, or other respiratory symptoms are present. He has not experienced rib fractures in the past.  For pain management, he was prescribed prednisone and Tylenol . Hydrocodone  was given in the ER but was not effective, and he has not continued its use. He has not utilized warm compresses, heat, or ice for pain relief.  He has been out of work for three weeks due to the injury. 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 The pain worsens when lying flat during sleep and upon waking.   The following portions of the patient's history were reviewed and updated as appropriate: past medical history, past surgical history, family history, social history,  allergies, medications, and problem list.   Patient Active Problem List   Diagnosis Date Noted   Convergence insufficiency 10/22/2019   Multiple environmental allergies 09/22/2019   Chronic low back pain 08/24/2019   Migraine with aura and without status migrainosus, not intractable 08/22/2019   ADHD 02/25/2017   Asthma 02/25/2017   Mild persistent asthma without complication 02/25/2017   Past Medical History:  Diagnosis Date   ADHD (attention deficit hyperactivity disorder)    Asthma    Finger fracture    Migraine    Multiple environmental allergies    Seasonal allergies    Past Surgical History:  Procedure Laterality Date   TONSILLECTOMY     Family History  Problem Relation Age of Onset   Hypertension Father    Hypertension Brother    Diabetes Maternal Grandmother    High Cholesterol Maternal Grandmother    Hypertension Maternal Grandmother    Mental illness Maternal Grandmother    Diabetes Maternal Grandfather    High Cholesterol Maternal Grandfather    Hypertension Maternal Grandfather    Cancer Paternal Grandmother    Heart disease Paternal Grandfather    Hypertension Paternal Grandfather    Social History   Socioeconomic History   Marital status: Single    Spouse name: Not on file   Number of children: Not on file   Years of education: Not on file   Highest education level: Not on file  Occupational History   Not on file  Tobacco Use  Smoking status: Never   Smokeless tobacco: Never  Vaping Use   Vaping status: Never Used  Substance and Sexual Activity   Alcohol use: Yes    Comment: daily   Drug use: No   Sexual activity: Yes    Partners: Female    Birth control/protection: None  Other Topics Concern   Not on file  Social History Narrative   Not on file   Social Drivers of Health   Financial Resource Strain: Not on file  Food Insecurity: Not on file  Transportation Needs: Not on file  Physical Activity: Not on file  Stress: Not on file   Social Connections: Not on file  Intimate Partner Violence: Not on file   Outpatient Medications Prior to Visit  Medication Sig Dispense Refill   amphetamine -dextroamphetamine  (ADDERALL) 10 MG tablet Take 2 tablets in the morning and 1 tablet in the afternoon 90 tablet 0   HYDROcodone -acetaminophen  (NORCO/VICODIN) 5-325 MG tablet Take 1-2 tablets by mouth every 6 (six) hours as needed. 15 tablet 0   No facility-administered medications prior to visit.   No Known Allergies  ROS: A complete ROS was performed with pertinent positives/negatives noted in the HPI. The remainder of the ROS are negative.   Objective:   Today's Vitals   06/18/24 0956 06/18/24 1041  BP: (!) 123/96 (!) 134/97  Pulse: 68   SpO2: 99%   Weight: 175 lb 11.2 oz (79.7 kg)   Height: 5' 9 (1.753 m)     GENERAL: Well-appearing, in NAD. Well nourished.  SKIN: Pink, warm and dry.  Head: Normocephalic. NECK: Trachea midline. Full ROM w/o pain or tenderness.  RESPIRATORY: Chest wall symmetrical. Respirations even and non-labored. Breath sounds clear to auscultation bilaterally.  Mild pain with palpation to right 9th and 10th rib areas.  No crepitus, deformity, or flail chest. CARDIAC: S1, S2 present, regular rate and rhythm without murmur or gallops. Peripheral pulses 2+ bilaterally.  MSK: Muscle tone and strength appropriate for age.  NEUROLOGIC: No motor or sensory deficits. Steady, even gait. C2-C12 intact.  PSYCH/MENTAL STATUS: Alert, oriented x 3. Cooperative, appropriate mood and affect.      Assessment & Plan:  1. Closed fracture of one rib of right side with delayed healing, subsequent encounter (Primary) Will repeat chest x-ray today given ongoing severe pain and recommend referral to orthopedics for further pain management with possible injection if needed.  Will send in oxycodone  for patient to use only with severe pain.  I recommend he stay out of work for at least another 1 to 2 weeks and avoid any  heavy lifting, strenuous activity.  I recommend patient start with an incentive spirometer at least 3-4 times a day as well.  He can continue to alternate Tylenol  and ibuprofen  as needed and use heating pad to the area for pain relief.  If worsening or no improvement prior to Ortho appointment, reach out to PCP. - oxyCODONE -acetaminophen  (PERCOCET/ROXICET) 5-325 MG tablet; Take 1 tablet by mouth every 8 (eight) hours as needed.  Dispense: 20 tablet; Refill: 0 - DG Ribs Unilateral W/Chest Right; Future  2. Encounter to establish care with new doctor Discussed role of PCP, reviewed medical, surgical and family history.  3. Elevated BP without diagnosis of HTN Likely blood pressure elevated due to ongoing pain.  Will recheck with patient's next appointment in 1 to 2 months.  Patient to reach out to office if new, worrisome, or unresolved symptoms arise or if no improvement in patient's condition. Patient verbalized  understanding and is agreeable to treatment plan. All questions answered to patient's satisfaction.    Return in about 7 weeks (around 08/06/2024) for AE and Rib Injury Follow up .    Thersia Schuyler Stark, [place]

## 2024-06-21 ENCOUNTER — Encounter (HOSPITAL_BASED_OUTPATIENT_CLINIC_OR_DEPARTMENT_OTHER): Payer: Self-pay | Admitting: Family Medicine

## 2024-06-21 NOTE — Telephone Encounter (Signed)
 Please see mychart message sent by pt and advise.

## 2024-06-28 NOTE — Telephone Encounter (Signed)
 Form returned to me by Thersia and given to [place] for her to contact pt to let him know it was ready.

## 2024-06-30 ENCOUNTER — Ambulatory Visit: Admitting: Orthopaedic Surgery

## 2024-08-06 ENCOUNTER — Encounter (HOSPITAL_BASED_OUTPATIENT_CLINIC_OR_DEPARTMENT_OTHER): Admitting: Family Medicine

## 2024-10-19 ENCOUNTER — Encounter (HOSPITAL_BASED_OUTPATIENT_CLINIC_OR_DEPARTMENT_OTHER): Admitting: Family Medicine
